# Patient Record
Sex: Female | Born: 1990 | Race: White | Hispanic: No | Marital: Single | Smoking: Never smoker
Health system: Southern US, Community
[De-identification: ages and names within clinical notes are randomized; demographics above are authoritative.]

---

## 2014-09-20 ENCOUNTER — Emergency Department (HOSPITAL_COMMUNITY): Payer: Medicaid Other

## 2014-09-20 ENCOUNTER — Encounter (HOSPITAL_COMMUNITY): Payer: Self-pay | Admitting: Emergency Medicine

## 2014-09-20 ENCOUNTER — Emergency Department (HOSPITAL_COMMUNITY)
Admission: EM | Admit: 2014-09-20 | Discharge: 2014-09-20 | Disposition: A | Discharge status: Home / Self Care | Payer: Medicaid Other | Attending: Emergency Medicine | Admitting: Emergency Medicine

## 2014-09-20 DIAGNOSIS — O2341 Unspecified infection of urinary tract in pregnancy, first trimester: Secondary | ICD-10-CM | POA: Insufficient documentation

## 2014-09-20 DIAGNOSIS — R197 Diarrhea, unspecified: Secondary | ICD-10-CM | POA: Insufficient documentation

## 2014-09-20 DIAGNOSIS — R1030 Lower abdominal pain, unspecified: Secondary | ICD-10-CM

## 2014-09-20 DIAGNOSIS — O9989 Other specified diseases and conditions complicating pregnancy, childbirth and the puerperium: Secondary | ICD-10-CM | POA: Insufficient documentation

## 2014-09-20 DIAGNOSIS — Z79899 Other long term (current) drug therapy: Secondary | ICD-10-CM | POA: Diagnosis not present

## 2014-09-20 DIAGNOSIS — Z3A01 Less than 8 weeks gestation of pregnancy: Secondary | ICD-10-CM | POA: Insufficient documentation

## 2014-09-20 DIAGNOSIS — O210 Mild hyperemesis gravidarum: Secondary | ICD-10-CM | POA: Diagnosis not present

## 2014-09-20 DIAGNOSIS — R112 Nausea with vomiting, unspecified: Secondary | ICD-10-CM

## 2014-09-20 DIAGNOSIS — Z880 Allergy status to penicillin: Secondary | ICD-10-CM | POA: Insufficient documentation

## 2014-09-20 DIAGNOSIS — Z3490 Encounter for supervision of normal pregnancy, unspecified, unspecified trimester: Secondary | ICD-10-CM

## 2014-09-20 DIAGNOSIS — N390 Urinary tract infection, site not specified: Secondary | ICD-10-CM

## 2014-09-20 DIAGNOSIS — R1013 Epigastric pain: Secondary | ICD-10-CM

## 2014-09-20 DIAGNOSIS — R102 Pelvic and perineal pain: Secondary | ICD-10-CM

## 2014-09-20 DIAGNOSIS — O26899 Other specified pregnancy related conditions, unspecified trimester: Secondary | ICD-10-CM

## 2014-09-20 LAB — BASIC METABOLIC PANEL
Anion gap: 13 (ref 5–15)
BUN: 9 mg/dL (ref 6–23)
CO2: 21 mEq/L (ref 19–32)
Calcium: 9.6 mg/dL (ref 8.4–10.5)
Chloride: 103 mEq/L (ref 96–112)
Creatinine, Ser: 0.6 mg/dL (ref 0.50–1.10)
GFR calc Af Amer: >90 mL/min (ref >90)
GFR calc non Af Amer: >90 mL/min (ref >90)
GLUCOSE: 100 mg/dL — AB (ref 70–99)
POTASSIUM: 4.2 meq/L (ref 3.7–5.3)
Sodium: 137 mEq/L (ref 137–147)

## 2014-09-20 LAB — URINE MICROSCOPIC-ADD ON

## 2014-09-20 LAB — URINALYSIS, ROUTINE W REFLEX MICROSCOPIC
BILIRUBIN URINE: NEGATIVE
GLUCOSE, UA: NEGATIVE mg/dL
Hgb urine dipstick: NEGATIVE
KETONES UR: NEGATIVE mg/dL
Nitrite: NEGATIVE
PH: 5 (ref 5.0–8.0)
Protein, ur: NEGATIVE mg/dL
Specific Gravity, Urine: 1.027 (ref 1.005–1.030)
Urobilinogen, UA: 0.2 mg/dL (ref 0.0–1.0)

## 2014-09-20 LAB — WET PREP, GENITAL
CLUE CELLS WET PREP: NONE SEEN
TRICH WET PREP: NONE SEEN
Yeast Wet Prep HPF POC: NONE SEEN

## 2014-09-20 LAB — CBC
HCT: 38.1 % (ref 36.0–46.0)
HEMOGLOBIN: 13.1 g/dL (ref 12.0–15.0)
MCH: 28.9 pg (ref 26.0–34.0)
MCHC: 34.4 g/dL (ref 30.0–36.0)
MCV: 84.1 fL (ref 78.0–100.0)
Platelets: 261 10*3/uL (ref 150–400)
RBC: 4.53 MIL/uL (ref 3.87–5.11)
RDW: 13.4 % (ref 11.5–15.5)
WBC: 7.2 10*3/uL (ref 4.0–10.5)

## 2014-09-20 LAB — HEPATIC FUNCTION PANEL
ALK PHOS: 92 U/L (ref 39–117)
ALT: 8 U/L (ref 0–35)
AST: 11 U/L (ref 0–37)
Albumin: 3.2 g/dL — ABNORMAL LOW (ref 3.5–5.2)
Total Protein: 7.2 g/dL (ref 6.0–8.3)

## 2014-09-20 LAB — LIPASE, BLOOD: Lipase: 24 U/L (ref 11–59)

## 2014-09-20 LAB — HCG, QUANTITATIVE, PREGNANCY: hCG, Beta Chain, Quant, S: 17638 m[IU]/mL — ABNORMAL HIGH (ref <5)

## 2014-09-20 LAB — POC URINE PREG, ED: Preg Test, Ur: POSITIVE — AB

## 2014-09-20 MED ORDER — NITROFURANTOIN MONOHYD MACRO 100 MG PO CAPS: 100.0000 mg | ORAL_CAPSULE | Freq: Two times a day (BID) | ORAL | Status: AC

## 2014-09-20 NOTE — ED Notes (Signed)
Bed: [id number] Expected date:  Expected time:  Means of arrival:  Comments: EMS 23 yo female with leg pain after fall

## 2014-09-20 NOTE — Discharge Instructions (Signed)
1. Medications: macrobid, usual home medications 2. Treatment: rest, drink plenty of fluids, use tums as needed; avoid spicy or greasy foods 3. Follow Up: Please followup with your OB/GYN in 3 days for discussion of your diagnoses and further evaluation after today's visit; if you do not have a primary care doctor use the resource guide provided to find one; Please return to the ER for worsening symptoms, vaginal bleeding or vaginal discharge.      Abdominal Pain During Pregnancy Belly (abdominal) pain is common during pregnancy. Most of the time, it is not a serious problem. Other times, it can be a sign that something is wrong with the pregnancy. Always tell your doctor if you have belly pain. HOME CARE Monitor your belly pain for any changes. The following actions may help you feel better:  Do not have sex (intercourse) or put anything in your vagina until you feel better.  Rest until your pain stops.  Drink clear fluids if you feel sick to your stomach (nauseous). Do not eat solid food until you feel better.  Only take medicine as told by your doctor.  Keep all doctor visits as told. GET HELP RIGHT AWAY IF:   You are bleeding, leaking fluid, or pieces of tissue come out of your vagina.  You have more pain or cramping.  You keep throwing up (vomiting).  You have pain when you pee (urinate) or have blood in your pee.  You have a fever.  You do not feel your baby moving as much.  You feel very weak or feel like passing out.  You have trouble breathing, with or without belly pain.  You have a very bad headache and belly pain.  You have fluid leaking from your vagina and belly pain.  You keep having watery poop (diarrhea).  Your belly pain does not go away after resting, or the pain gets worse. MAKE SURE YOU:   Understand these instructions.  Will watch your condition.  Will get help right away if you are not doing well or get worse. Document Released: 10/21/2009  Document Revised: 07/05/2013 Document Reviewed: 06/01/2013 [facility] Patient Information 2015 [place], [place]. This information is not intended to replace advice given to you by your health care provider. Make sure you discuss any questions you have with your health care provider.

## 2014-09-20 NOTE — ED Notes (Signed)
Patient transported to Ultrasound 

## 2014-09-20 NOTE — ED Provider Notes (Signed)
CSN: [id number]     Arrival date & time 09/20/14  1736 History   First MD Initiated Contact with Patient 09/20/14 2012     Chief Complaint  Patient presents with  . Abdominal Pain  . Routine Prenatal Visit     (Consider location/radiation/quality/duration/timing/severity/associated sxs/prior Treatment) Patient is a 23 y.o. female presenting with abdominal pain. The history is provided by the patient and medical records. No language interpreter was used.  Abdominal Pain Associated symptoms: diarrhea (loose stool), nausea and vomiting   Associated symptoms: no chest pain, no constipation, no cough, no dysuria, no fatigue, no fever, no hematuria and no shortness of breath      Jessica Perez is a 23 y.o. female  [id number] with no known medical Hx presents to the Emergency Department complaining of gradual, persistent, progressively worsening upper abd/epigastric abd pain onset 3 days ago.  Pt reports she had a positive preg test 2 days ago.  Pt reports Associated nausea, vomiting and loose stools all without melena, hematochezia or hematemesis.  Nothing makes it better and nothing  makes it worse.  Pt denies fever, chills, lower abd pain, vaginal discharge, vaginal bleeding.  Pt reports she did not have pain like this with her other pregnancies.     History reviewed. No pertinent past medical history. History reviewed. No pertinent past surgical history. No family history on file. History  Substance Use Topics  . Smoking status: Never Smoker   . Smokeless tobacco: Not on file  . Alcohol Use: No   OB History    No data available     Review of Systems  Constitutional: Negative for fever, diaphoresis, appetite change, fatigue and unexpected weight change.  HENT: Negative for mouth sores and trouble swallowing.   Respiratory: Negative for cough, chest tightness, shortness of breath, wheezing and stridor.   Cardiovascular: Negative for chest pain and palpitations.  Gastrointestinal: Positive  for nausea, vomiting, abdominal pain and diarrhea (loose stool). Negative for constipation, blood in stool, abdominal distention and rectal pain.  Genitourinary: Negative for dysuria, urgency, frequency, hematuria, flank pain and difficulty urinating.  Musculoskeletal: Negative for back pain, neck pain and neck stiffness.  Skin: Negative for rash.  Neurological: Negative for weakness.  Hematological: Negative for adenopathy.  Psychiatric/Behavioral: Negative for confusion.  All other systems reviewed and are negative.     Allergies  Penicillins  Home Medications   Prior to Admission medications   Medication Sig Start Date End Date Taking? Authorizing Provider  nitrofurantoin, macrocrystal-monohydrate, (MACROBID) 100 MG capsule Take 1 capsule (100 mg total) by mouth 2 (two) times daily. X 7 days 09/20/14   Dahlia ClientHannah Arlen Dupuis, PA-C   BP 126/79 mmHg  Pulse 91  Temp(Src) 97.9 F (36.6 C) (Oral)  Resp 16  SpO2 99%  LMP 08/24/2014 (Approximate) Physical Exam  Constitutional: She appears well-developed and well-nourished. No distress.  Awake, alert, nontoxic appearance  HENT:  Head: Normocephalic and atraumatic.  Mouth/Throat: Oropharynx is clear and moist. No oropharyngeal exudate.  Eyes: Conjunctivae are normal. No scleral icterus.  Neck: Normal range of motion. Neck supple.  Cardiovascular: Normal rate, regular rhythm, normal heart sounds and intact distal pulses.   No murmur heard. Pulmonary/Chest: Effort normal and breath sounds normal. No respiratory distress. She has no wheezes.  Equal chest expansion  Abdominal: Soft. Bowel sounds are normal. She exhibits no distension and no mass. There is no tenderness. There is no rebound and no guarding. Hernia confirmed negative in the right inguinal area and confirmed negative in  the left inguinal area.  Pt reports pain is in the epigastrium when palpated, but reports the palpation does not increase the pain No guarding, rigidity,  peritoneal signs, or positive murphy's sign  Genitourinary: No labial fusion. There is no rash, tenderness or lesion on the right labia. There is no rash, tenderness or lesion on the left labia. Uterus is enlarged (gravid). Uterus is not deviated, not fixed and not tender. Cervix exhibits no motion tenderness, no discharge and no friability. Right adnexum displays no mass, no tenderness and no fullness. Left adnexum displays no mass, no tenderness and no fullness. No erythema, tenderness or bleeding in the vagina. No foreign body around the vagina. No signs of injury around the vagina. Vaginal discharge ( thick white, leukorrhea) found.  Musculoskeletal: Normal range of motion. She exhibits no edema.  Lymphadenopathy:       Right: No inguinal adenopathy present.       Left: No inguinal adenopathy present.  Neurological: She is alert. She exhibits normal muscle tone. Coordination normal.  Speech is clear and goal oriented Moves extremities without ataxia  Skin: Skin is warm and dry. She is not diaphoretic. No erythema.  Psychiatric: She has a normal mood and affect.  Nursing note and vitals reviewed.   ED Course  Procedures (including critical care time) Labs Review Labs Reviewed  WET PREP, GENITAL - Abnormal; Notable for the following:    WBC, Wet Prep HPF POC MODERATE (*)    All other components within normal limits  URINALYSIS, ROUTINE W REFLEX MICROSCOPIC - Abnormal; Notable for the following:    APPearance TURBID (*)    Leukocytes, UA LARGE (*)    All other components within normal limits  BASIC METABOLIC PANEL - Abnormal; Notable for the following:    Glucose, Bld 100 (*)    All other components within normal limits  HCG, QUANTITATIVE, PREGNANCY - Abnormal; Notable for the following:    hCG, Beta Chain, Quant, S 75643 (*)    All other components within normal limits  URINE MICROSCOPIC-ADD ON - Abnormal; Notable for the following:    Squamous Epithelial / LPF MANY (*)     Bacteria, UA MANY (*)    All other components within normal limits  HEPATIC FUNCTION PANEL - Abnormal; Notable for the following:    Albumin 3.2 (*)    Total Bilirubin <0.2 (*)    All other components within normal limits  POC URINE PREG, ED - Abnormal; Notable for the following:    Preg Test, Ur POSITIVE (*)    All other components within normal limits  GC/CHLAMYDIA PROBE AMP  URINE CULTURE  CBC  LIPASE, BLOOD  ABO/RH    Imaging Review US Ob Comp Less 14 Wks  09/20/2014   CLINICAL DATA:  Pelvic pain. Estimated gestational age by LMP is 8 weeks 2 days. Quantitative beta HCG reports are not provided.  EXAM: OBSTETRIC <14 WK [place] AND TRANSVAGINAL OB US  TECHNIQUE: Both transabdominal and transvaginal ultrasound examinations were performed for complete evaluation of the gestation as well as the maternal uterus, adnexal regions, and pelvic cul-de-sac. Transvaginal technique was performed to assess early pregnancy.  COMPARISON:  None.  FINDINGS: Intrauterine gestational sac: Single intrauterine gestational sac is identified.  Yolk sac:  Present.  Embryo:  Present.  Cardiac Activity: Visualized.  Heart Rate:  112 bpm  CRL:   2.4  mm   5 w 6 d  [place] EDC: 05/17/2015  Maternal uterus/adnexae: Uterus is anteverted. No myometrial mass lesions are identified. No subchorionic hemorrhage visualized. Cervix appears unremarkable. Both ovaries are visualized and demonstrate normal follicular changes. No abnormal adnexal masses. No free pelvic fluid collections.  IMPRESSION: Single intrauterine pregnancy. Estimated gestational age by crown-rump length is 5 weeks 6 days. No acute complication is visualized.   Electronically Signed   By: Burman Nieves M.D.   On: 09/20/2014 21:27   US Ob Transvaginal  09/20/2014   CLINICAL DATA:  Pelvic pain. Estimated gestational age by LMP is 8 weeks 2 days. Quantitative beta HCG reports are not provided.  EXAM: OBSTETRIC <14 WK [place] AND TRANSVAGINAL OB US  TECHNIQUE:  Both transabdominal and transvaginal ultrasound examinations were performed for complete evaluation of the gestation as well as the maternal uterus, adnexal regions, and pelvic cul-de-sac. Transvaginal technique was performed to assess early pregnancy.  COMPARISON:  None.  FINDINGS: Intrauterine gestational sac: Single intrauterine gestational sac is identified.  Yolk sac:  Present.  Embryo:  Present.  Cardiac Activity: Visualized.  Heart Rate:  112 bpm  CRL:   2.4  mm   5 w 6 d                  [place] EDC: 05/17/2015  Maternal uterus/adnexae: Uterus is anteverted. No myometrial mass lesions are identified. No subchorionic hemorrhage visualized. Cervix appears unremarkable. Both ovaries are visualized and demonstrate normal follicular changes. No abnormal adnexal masses. No free pelvic fluid collections.  IMPRESSION: Single intrauterine pregnancy. Estimated gestational age by crown-rump length is 5 weeks 6 days. No acute complication is visualized.   Electronically Signed   By: Burman Nieves M.D.   On: 09/20/2014 21:27     EKG Interpretation None      MDM   Final diagnoses:  Lower abdominal pain  Epigastric abdominal pain  Non-intractable vomiting with nausea, vomiting of unspecified type  UTI (lower urinary tract infection)  Pregnancy   Jessica Perez presents with epigastric abd pain after finding out that she was pregnant 2 days ago.  Pt with benign abd and vaginal exam with closed os.  Will obtain US to r/o ectopic, hepatic function panel and lipase pending.    BP 126/79 mmHg  Pulse 91  Temp(Src) 97.9 F (36.6 C) (Oral)  Resp 16  SpO2 99%  LMP 08/24/2014 (Approximate)   9:45 PM Repeat exam shows soft and nontender abd.  Pt labs reassuring.  OB ultrasound with single intrauterine pregnancy, gestation approximately 5 weeks. Patient tolerated by mouth here in the emergency department. Urinalysis concerning for possible early urinary tract infection.   Patient is nontoxic, nonseptic  appearing, in no apparent distress.  Patient's pain and other symptoms adequately managed in emergency department.  Labs, imaging and vitals reviewed.  Patient does not meet the SIRS or Sepsis criteria.  On repeat exam patient does not have a surgical abdomin and there are no peritoneal signs.  No indication of appendicitis, bowel obstruction, bowel perforation, cholecystitis, diverticulitis, PID, tubo-ovarian abscess or ectopic pregnancy.   Patient will be discharged home with Macrobid. She is to follow-up with her gynecologist within 3 days for further evaluation. She is to return for worsening symptoms, persistent vomiting or other concerning symptoms.    I have personally reviewed patient's vitals, nursing note and any pertinent labs or imaging.  I performed an undressed physical exam.    It has been determined that no acute conditions requiring further emergency intervention are present at this  time. The patient/guardian have been advised of the diagnosis and plan. I reviewed all labs and imaging including any potential incidental findings. We have discussed signs and symptoms that warrant return to the ED and they are listed in the discharge instructions.    Vital signs are stable at discharge.   BP 126/79 mmHg  Pulse 91  Temp(Src) 97.9 F (36.6 C) (Oral)  Resp 16  SpO2 99%  LMP 08/24/2014 (Approximate)        Dierdre ForthHannah Alyscia Carmon, PA-C 09/20/14 2152  Gilda Creasehristopher J. Pollina, MD 09/20/14 2204

## 2014-09-20 NOTE — ED Notes (Signed)
Per patient, states she found out she was pregnant 2 days ago-having upper abdominal pain

## 2014-09-21 LAB — GC/CHLAMYDIA PROBE AMP
CT Probe RNA: NEGATIVE
GC Probe RNA: NEGATIVE

## 2014-09-22 LAB — URINE CULTURE: Colony Count: >=100000

## 2014-09-24 ENCOUNTER — Telehealth (HOSPITAL_COMMUNITY): Payer: Self-pay

## 2014-09-24 NOTE — Telephone Encounter (Signed)
Post ED Visit - Positive Culture Follow-up  Culture report reviewed by antimicrobial stewardship pharmacist: []  Wes Dulaney, Pharm.D., BCPS []  Celedonio MiyamotoJeremy Frens, Pharm.D., BCPS []  Georgina PillionElizabeth Martin, Pharm.D., BCPS []  Sam RayburnMinh Pham, [address]Ph[address]D., BCPS, AAHIVP [x]  Estella HuskMichelle Turner, Pharm.D., BCPS, AAHIVP []  Babs BertinHaley Baird, [address]Ph[address]   Positive Urine culture Treated with Nitrofurantoin, organism sensitive to the same and no further patient follow-up is required at this time.  Arvid RightClark, Anay Walter Dorn 09/24/2014, 4:45 AM

## 2015-07-04 LAB — ABO/RH
ABO/RH(D): O NEG
ANTIBODY SCREEN: NEGATIVE

## 2016-04-04 IMAGING — US US OB COMP LESS 14 WK
1 series · 14 of 28 positions shown · non-contrast
Comparison: None.

CLINICAL DATA: Pelvic pain. Estimated gestational age by LMP is 8
weeks 2 days. Quantitative beta HCG reports are not provided.

EXAM:
OBSTETRIC <14 WK US AND TRANSVAGINAL OB US
TECHNIQUE: Both transabdominal and transvaginal ultrasound examinations were
performed for complete evaluation of the gestation as well as the
maternal uterus, adnexal regions, and pelvic cul-de-sac.
Transvaginal technique was performed to assess early pregnancy.

[Series 1: us ob comp less 14 wk · 0.22mm/px · 14 of 62 slices shown]
[im 3/62]
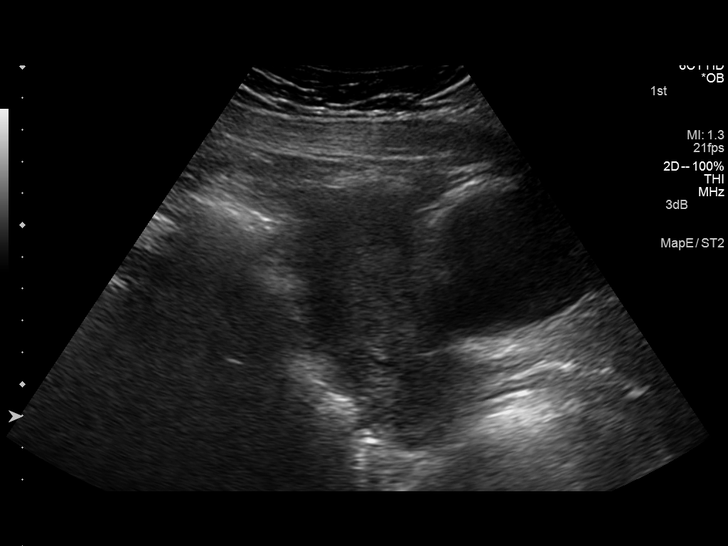
[im 7/62]
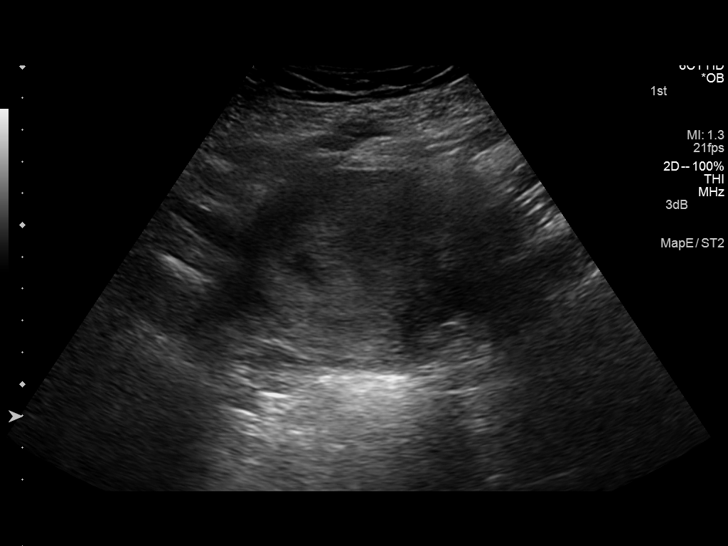
[im 12/62]
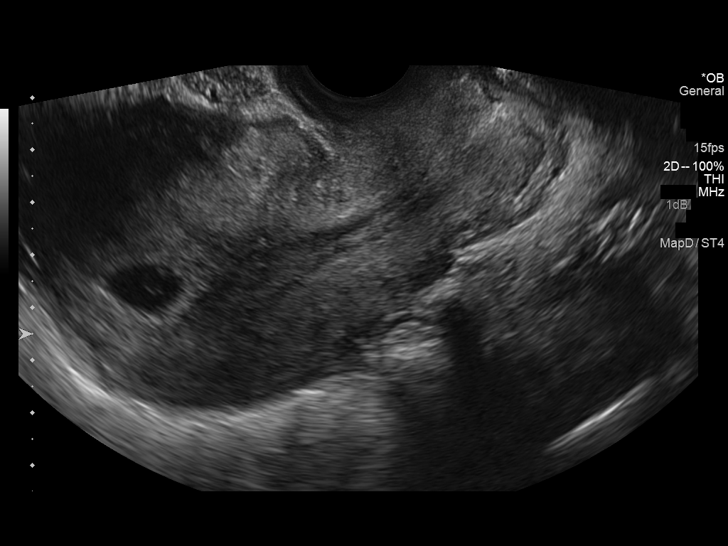
[im 16/62]
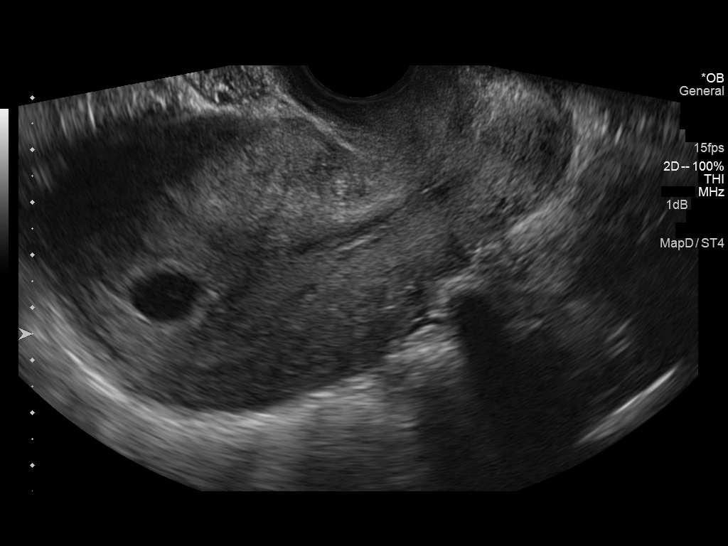
[im 21/62]
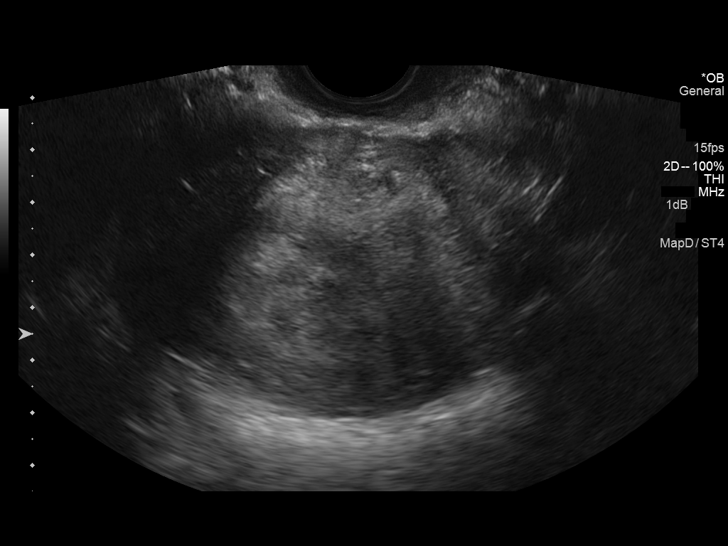
[im 25/62]
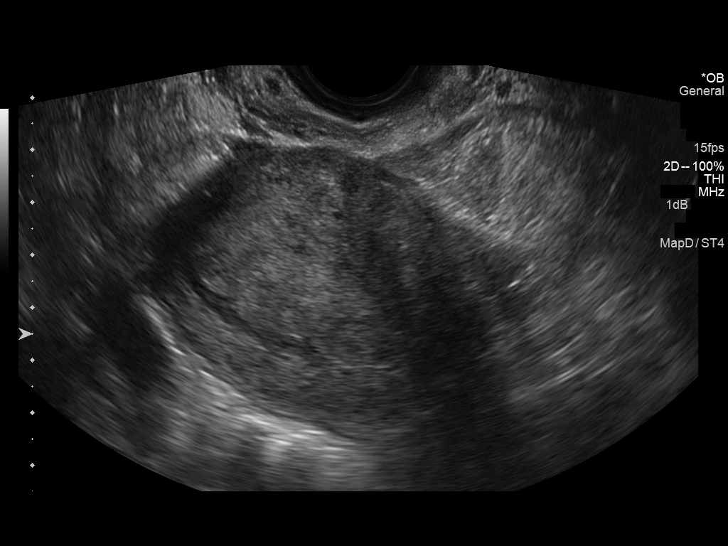
[im 30/62]
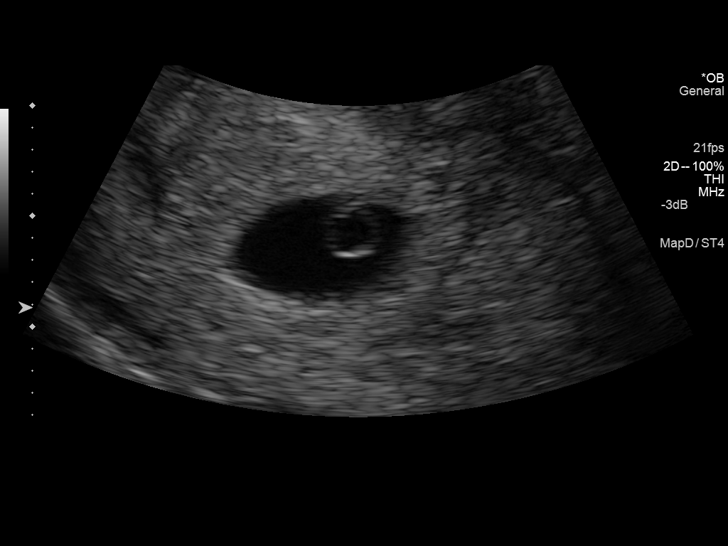
[im 34/62]
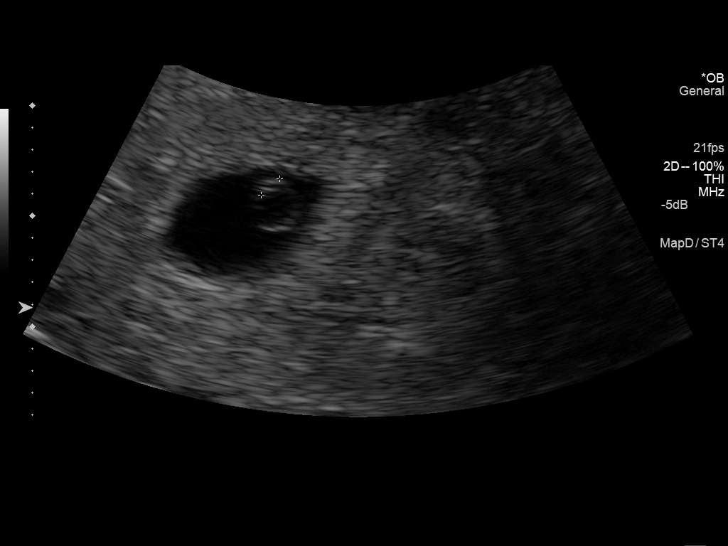
[im 39/62]
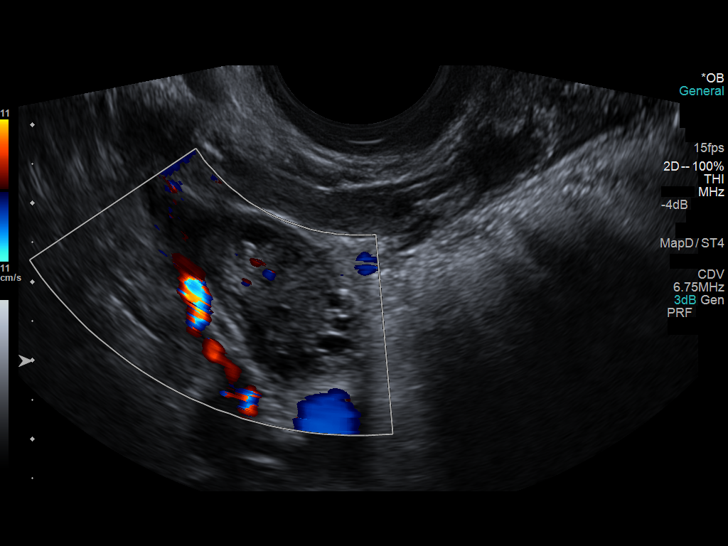
[im 43/62]
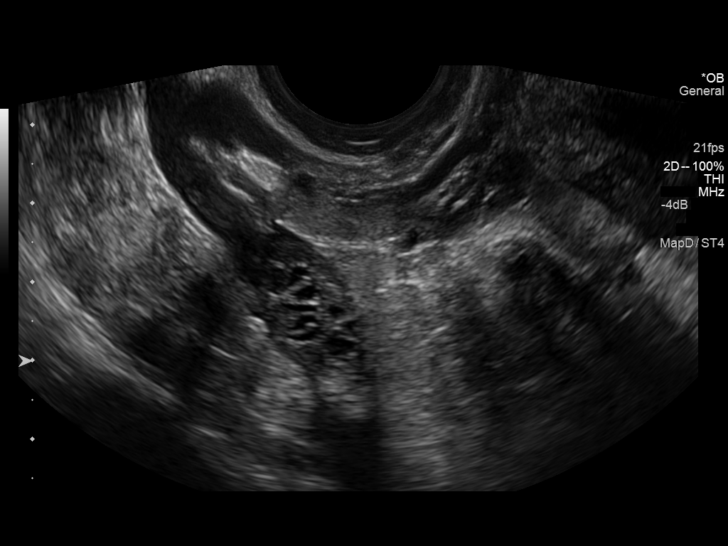
[im 48/62]
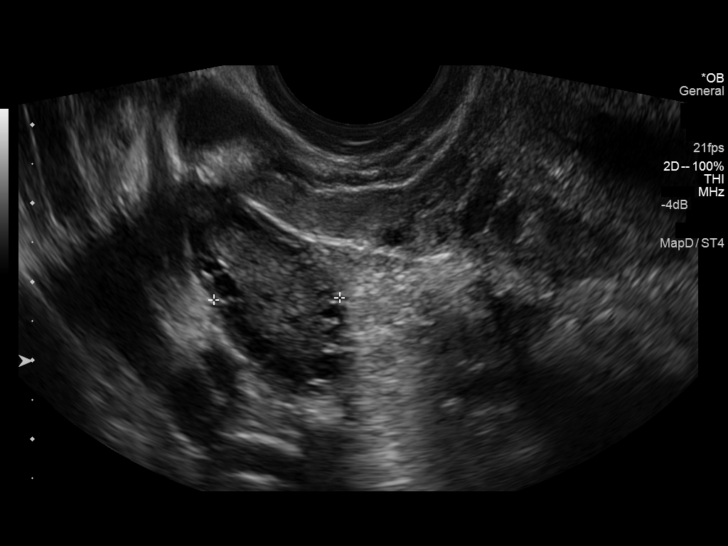
[im 52/62]
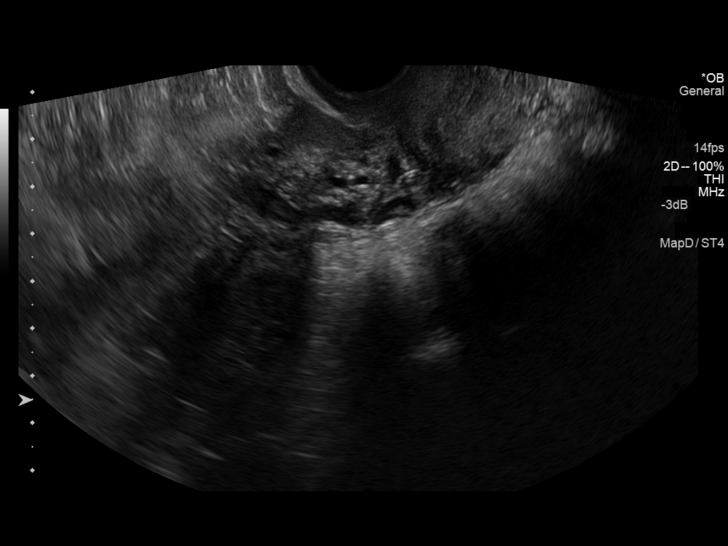
[im 57/62]
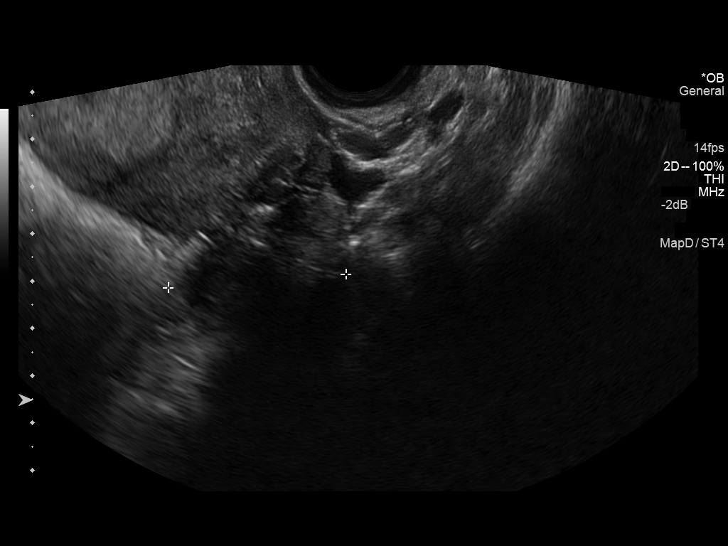
[im 62/62]
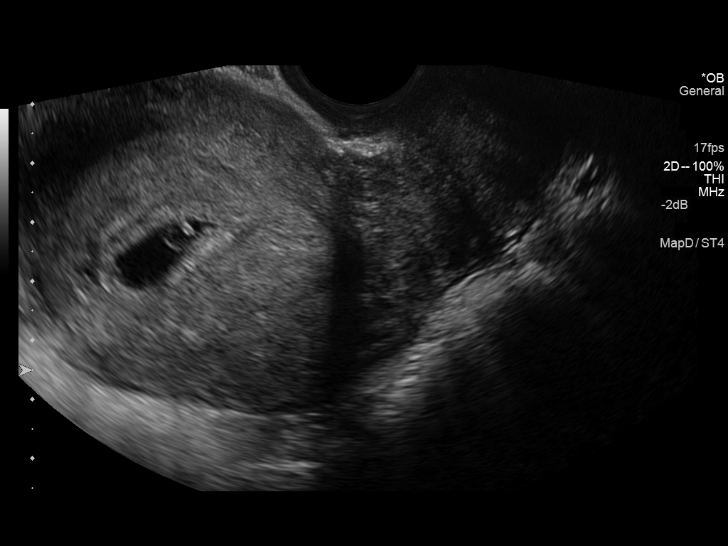

[14 of 28 positions shown; findings below may reference images not displayed]

FINDINGS: Intrauterine gestational sac: Single intrauterine gestational sac is
identified.

Yolk sac:  Present.

Embryo:  Present.

Cardiac Activity: Visualized.

Heart Rate:  112 bpm

CRL:   2.4  mm   5 w 6 d                  [place] EDC: 05/17/2015

Maternal uterus/adnexae: Uterus is anteverted. No myometrial mass
lesions are identified. No subchorionic hemorrhage visualized.
Cervix appears unremarkable. Both ovaries are visualized and
demonstrate normal follicular changes. No abnormal adnexal masses.
No free pelvic fluid collections.
IMPRESSION: Single intrauterine pregnancy. Estimated gestational age by
crown-rump length is 5 weeks 6 days. No acute complication is
visualized.

## 2022-06-16 ENCOUNTER — Ambulatory Visit
Admission: EM | Admit: 2022-06-16 | Discharge: 2022-06-16 | Disposition: A | Discharge status: Home / Self Care | Payer: Medicaid Other | Attending: Nurse Practitioner | Admitting: Nurse Practitioner

## 2022-06-16 DIAGNOSIS — N898 Other specified noninflammatory disorders of vagina: Secondary | ICD-10-CM | POA: Insufficient documentation

## 2022-06-16 DIAGNOSIS — N760 Acute vaginitis: Secondary | ICD-10-CM | POA: Diagnosis present

## 2022-06-16 MED ORDER — FLUCONAZOLE 150 MG PO TABS: 150.0000 mg | ORAL_TABLET | Freq: Once | ORAL | 0 refills | Status: AC

## 2022-06-16 NOTE — Discharge Instructions (Signed)
Cytology results are pending at this time.  Did not repeat your urinalysis as you are currently on treatment for urinary tract infection. Your cytology results should be available within the next 48 to 72 hours.  If your results are positive, you will be contacted to discuss treatment. Increase condom use with each sexual encounter. Follow-up as needed.

## 2022-06-16 NOTE — ED Triage Notes (Signed)
Pt reports yellow vaginal discharge and vaginal itching x 1 day. Pt requested STD's test.

## 2022-06-16 NOTE — ED Provider Notes (Signed)
[facility]    CSN: [id number] Arrival date & time: 06/16/22  1710      History   Chief Complaint Chief Complaint  Patient presents with   Vaginal Discharge    HPI Jessica Perez is a 31 y.o. female.   The history is provided by the patient.   Patient presents for vaginal itching and vaginal discharge that started approximately 24 hours ago.  Patient states the discharge is yellow.  She denies vaginal odor.  Patient is currently being treated by her primary care physician for urinary tract infection.  She has a history of recurrent urinary tract infections and kidney stones.  She denies any fever, chills, abdominal pain, nausea, vomiting, or diarrhea.  Patient would also like to get STI testing today.  She states that she was tested by her PCP approximately 2 to 3 weeks ago and since that time she has had 2 additional partners.  Patient reports previous history of chlamydia.  History reviewed. No pertinent past medical history.  There are no problems to display for this patient.   History reviewed. No pertinent surgical history.  OB History   No obstetric history on file.      Home Medications    Prior to Admission medications   Medication Sig Start Date End Date Taking? Authorizing Provider  fluconazole (DIFLUCAN) 150 MG tablet Take 1 tablet (150 mg total) by mouth once for 1 dose. 06/16/22 06/16/22 Yes Zakaria Fromer-Warren, Sadie Haber, NP  levonorgestrel (MIRENA, 52 MG,) 20 MCG/DAY IUD by Intrauterine route.    [provider]  nitrofurantoin, macrocrystal-monohydrate, (MACROBID) 100 MG capsule Take 1 capsule (100 mg total) by mouth 2 (two) times daily. X 7 days 09/20/14   Muthersbaugh, Dahlia Client, PA-C  phentermine 37.5 MG capsule Take 37.5 mg by mouth every morning. 05/20/22   [provider]  Vitamin D, Ergocalciferol, (DRISDOL) 1.25 MG (50000 UNIT) CAPS capsule Take 50,000 Units by mouth once a week. 05/20/22   [provider]    Family  History History reviewed. No pertinent family history.  Social History Social History   Tobacco Use   Smoking status: Never  Substance Use Topics   Alcohol use: No   Drug use: No     Allergies   Penicillins and Polymyxin b   Review of Systems Review of Systems Per HPI  Physical Exam Triage Vital Signs ED Triage Vitals  Enc Vitals Group     BP 06/16/22 1723 (!) 142/87     Pulse Rate 06/16/22 1723 91     Resp 06/16/22 1723 17     Temp 06/16/22 1723 98.3 F (36.8 C)     Temp Source 06/16/22 1723 Oral     SpO2 06/16/22 1723 97 %     Weight --      Height --      Head Circumference --      Peak Flow --      Pain Score 06/16/22 1722 0     Pain Loc --      Pain Edu? --      Excl. in GC? --    No data found.  Updated Vital Signs BP (!) 142/87 (BP Location: Right Arm)   Pulse 91   Temp 98.3 F (36.8 C) (Oral)   Resp 17   LMP  (Approximate)   SpO2 97%   Visual Acuity Right Eye Distance:   Left Eye Distance:   Bilateral Distance:    Right Eye Near:   Left Eye  Near:    Bilateral Near:     Physical Exam Vitals reviewed.  Constitutional:      General: She is not in acute distress.    Appearance: She is well-developed.  HENT:     Head: Normocephalic.  Eyes:     Extraocular Movements: Extraocular movements intact.     Pupils: Pupils are equal, round, and reactive to light.  Cardiovascular:     Rate and Rhythm: Normal rate and regular rhythm.     Pulses: Normal pulses.     Heart sounds: Normal heart sounds.  Pulmonary:     Effort: Pulmonary effort is normal.     Breath sounds: Normal breath sounds.  Abdominal:     General: Bowel sounds are normal. There is no distension.     Palpations: Abdomen is soft.     Tenderness: There is no abdominal tenderness. There is no guarding or rebound.  Genitourinary:    Comments: Self swab performed by patient. Musculoskeletal:     Cervical back: Normal range of motion.  Skin:    General: Skin is warm and dry.      Findings: No erythema or rash.  Neurological:     General: No focal deficit present.     Mental Status: She is alert and oriented to person, place, and time.     Cranial Nerves: No cranial nerve deficit.  Psychiatric:        Mood and Affect: Mood normal.        Behavior: Behavior normal.      UC Treatments / Results  Labs (all labs ordered are listed, but only abnormal results are displayed) Labs Reviewed  CERVICOVAGINAL ANCILLARY ONLY    EKG   Radiology No results found.  Procedures Procedures (including critical care time)  Medications Ordered in UC Medications - No data to display  Initial Impression / Assessment and Plan / UC Course  I have reviewed the triage vital signs and the nursing notes.  Pertinent labs & imaging results that were available during my care of the patient were reviewed by me and considered in my medical decision making (see chart for details).  Patient presents for complaints of vaginal discharge and vaginal itching that is been present for the past 24 hours.  Patient is currently being treated for urinary tract infection, patient has history of recurrent urinary tract infections and kidney stones..  Cytology results are pending at this time.  Did not repeat urinalysis as patient is currently under treatment.  Patient treated for possible yeast infection with fluconazole.  Supportive care recommendations were provided to the patient.  Patient was advised that she will be contacted if her cytology results are positive.  Patient advised to follow-up as needed. Final Clinical Impressions(s) / UC Diagnoses   Final diagnoses:  Acute vaginitis     Discharge Instructions      Cytology results are pending at this time.  Did not repeat your urinalysis as you are currently on treatment for urinary tract infection. Your cytology results should be available within the next 48 to 72 hours.  If your results are positive, you will be contacted to discuss  treatment. Increase condom use with each sexual encounter. Follow-up as needed.     ED Prescriptions     Medication Sig Dispense Auth. Provider   fluconazole (DIFLUCAN) 150 MG tablet Take 1 tablet (150 mg total) by mouth once for 1 dose. 1 tablet Deloy Archey-Warren, Sadie Haber, NP      PDMP not reviewed  this encounter.   Abran Cantor, NP 06/16/22 1745

## 2022-06-17 ENCOUNTER — Telehealth (HOSPITAL_COMMUNITY): Payer: Self-pay | Admitting: Emergency Medicine

## 2022-06-17 LAB — CERVICOVAGINAL ANCILLARY ONLY
Bacterial Vaginitis (gardnerella): POSITIVE — AB
Candida Glabrata: NEGATIVE
Candida Vaginitis: NEGATIVE
Chlamydia: NEGATIVE
Comment: NEGATIVE
Comment: NEGATIVE
Comment: NEGATIVE
Comment: NEGATIVE
Comment: NEGATIVE
Comment: NORMAL
Neisseria Gonorrhea: NEGATIVE
Trichomonas: POSITIVE — AB

## 2022-06-17 MED ORDER — METRONIDAZOLE 500 MG PO TABS: 500.0000 mg | ORAL_TABLET | Freq: Two times a day (BID) | ORAL | 0 refills | Status: AC
